# Patient Record
Sex: Female | Born: 1975 | Hispanic: Yes | Marital: Married | Smoking: Never smoker
Health system: Southern US, Community
[De-identification: ages and names within clinical notes are randomized; demographics above are authoritative.]

## PROBLEM LIST (undated history)

## (undated) DIAGNOSIS — K219 Gastro-esophageal reflux disease without esophagitis: Secondary | ICD-10-CM

## (undated) DIAGNOSIS — G43909 Migraine, unspecified, not intractable, without status migrainosus: Secondary | ICD-10-CM

## (undated) DIAGNOSIS — J45909 Unspecified asthma, uncomplicated: Secondary | ICD-10-CM

## (undated) HISTORY — DX: Unspecified asthma, uncomplicated: J45.909

---

## 2016-05-22 ENCOUNTER — Other Ambulatory Visit: Payer: Self-pay | Admitting: Family Medicine

## 2016-05-23 ENCOUNTER — Other Ambulatory Visit: Payer: Self-pay | Admitting: Family Medicine

## 2016-05-23 DIAGNOSIS — O26859 Spotting complicating pregnancy, unspecified trimester: Secondary | ICD-10-CM

## 2016-05-23 DIAGNOSIS — O0990 Supervision of high risk pregnancy, unspecified, unspecified trimester: Secondary | ICD-10-CM

## 2016-05-23 DIAGNOSIS — Z315 Encounter for genetic counseling: Secondary | ICD-10-CM

## 2016-05-25 ENCOUNTER — Other Ambulatory Visit: Payer: Self-pay | Admitting: Family Medicine

## 2016-05-25 DIAGNOSIS — Z369 Encounter for antenatal screening, unspecified: Secondary | ICD-10-CM

## 2016-05-30 ENCOUNTER — Ambulatory Visit
Admission: RE | Admit: 2016-05-30 | Discharge: 2016-05-30 | Disposition: A | Discharge status: Home / Self Care | Payer: Commercial Managed Care - PPO | Source: Ambulatory Visit | Attending: Family Medicine | Admitting: Family Medicine

## 2016-05-30 DIAGNOSIS — O0991 Supervision of high risk pregnancy, unspecified, first trimester: Secondary | ICD-10-CM | POA: Diagnosis not present

## 2016-05-30 DIAGNOSIS — O26851 Spotting complicating pregnancy, first trimester: Secondary | ICD-10-CM | POA: Diagnosis present

## 2016-05-30 DIAGNOSIS — Z315 Encounter for genetic counseling: Secondary | ICD-10-CM | POA: Diagnosis not present

## 2016-05-30 DIAGNOSIS — O468X1 Other antepartum hemorrhage, first trimester: Secondary | ICD-10-CM | POA: Diagnosis not present

## 2016-05-30 DIAGNOSIS — Z3A01 Less than 8 weeks gestation of pregnancy: Secondary | ICD-10-CM | POA: Insufficient documentation

## 2016-05-30 DIAGNOSIS — O0990 Supervision of high risk pregnancy, unspecified, unspecified trimester: Secondary | ICD-10-CM

## 2016-05-30 DIAGNOSIS — O26859 Spotting complicating pregnancy, unspecified trimester: Secondary | ICD-10-CM

## 2016-06-09 DIAGNOSIS — J45909 Unspecified asthma, uncomplicated: Secondary | ICD-10-CM | POA: Insufficient documentation

## 2016-06-22 ENCOUNTER — Ambulatory Visit: Payer: Commercial Managed Care - PPO

## 2016-07-03 ENCOUNTER — Telehealth: Payer: Self-pay | Admitting: Obstetrics and Gynecology

## 2016-07-03 NOTE — Telephone Encounter (Signed)
[name] called to cancel her genetic counseling and ultrasound appointments at [facility] which were planned for 07/06/2016.  She stated that she is getting all of [facility] and does not need to come here.    Cherly Andersoneborah F. Kushal Saunders, MS, CGC

## 2016-07-06 ENCOUNTER — Other Ambulatory Visit: Payer: Commercial Managed Care - PPO

## 2016-11-24 DIAGNOSIS — K219 Gastro-esophageal reflux disease without esophagitis: Secondary | ICD-10-CM | POA: Insufficient documentation

## 2019-09-11 ENCOUNTER — Telehealth: Payer: Self-pay | Admitting: Advanced Practice Midwife

## 2019-09-11 NOTE — Telephone Encounter (Signed)
Called and spoke with [facility] about review from our provider to have patient return with in one year with PCP for papsmear

## 2019-09-11 NOTE — Telephone Encounter (Signed)
Incoming referral from [facility] center presents with abnormal cervix exam possible HPV please eval the need the need for furthur eval. Pap is pending. Per Review from [place]. Patient is normal with no need for referral.

## 2020-07-15 ENCOUNTER — Encounter: Payer: Self-pay | Admitting: Obstetrics and Gynecology

## 2020-07-15 ENCOUNTER — Other Ambulatory Visit (HOSPITAL_COMMUNITY)
Admission: RE | Admit: 2020-07-15 | Discharge: 2020-07-15 | Disposition: A | Discharge status: Home / Self Care | Payer: Commercial Managed Care - PPO | Source: Ambulatory Visit | Attending: Obstetrics and Gynecology | Admitting: Obstetrics and Gynecology

## 2020-07-15 ENCOUNTER — Other Ambulatory Visit: Payer: Self-pay

## 2020-07-15 ENCOUNTER — Ambulatory Visit (INDEPENDENT_AMBULATORY_CARE_PROVIDER_SITE_OTHER): Payer: Commercial Managed Care - PPO | Admitting: Obstetrics and Gynecology

## 2020-07-15 VITALS — BP 119/76 | HR 69 | Ht 65.0 in | Wt 199.9 lb

## 2020-07-15 DIAGNOSIS — N8189 Other female genital prolapse: Secondary | ICD-10-CM

## 2020-07-15 NOTE — Addendum Note (Signed)
Addended by: Dorian Pod on: 07/15/2020 09:12 AM   Modules accepted: Orders

## 2020-07-15 NOTE — Progress Notes (Signed)
HPI:      [name] is a 45 y.o. No obstetric history on file. who LMP was No LMP recorded.  Subjective:   She presents today as a referral from [facility].  She says that the doctor looked at her cervix and thought something was abnormal.  He "saw a shadow".  Her last Pap smear was 1 year ago and although I cannot see the results I can see a call where it was recommended she have a follow-up Pap in 1 year. Of significant note the patient has no pelvic complaints. She does state that she had a very large baby and a somewhat difficult time with her last pregnancy. A call to [facility] provided no further information.    Hx: The following portions of the patient's history were reviewed and updated as appropriate:             She  has a past medical history of Asthma. She does not have a problem list on file. She  has no past surgical history on file. Her family history includes Diabetes in her mother; Lung cancer in her mother. She  reports that she has never smoked. She has never used smokeless tobacco. She reports that she does not drink alcohol and does not use drugs. She has a current medication list which includes the following prescription(s): ranitidine and fluticasone. She has No Known Allergies.       Review of Systems:  Review of Systems  Constitutional: Denied constitutional symptoms, night sweats, recent illness, fatigue, fever, insomnia and weight loss.  Eyes: Denied eye symptoms, eye pain, photophobia, vision change and visual disturbance.  Ears/Nose/Throat/Neck: Denied ear, nose, throat or neck symptoms, hearing loss, nasal discharge, sinus congestion and sore throat.  Cardiovascular: Denied cardiovascular symptoms, arrhythmia, chest pain/pressure, edema, exercise intolerance, orthopnea and palpitations.  Respiratory: Denied pulmonary symptoms, asthma, pleuritic pain, productive sputum, cough, dyspnea and wheezing.   Gastrointestinal: Denied, gastro-esophageal reflux, melena, nausea and vomiting.  Genitourinary: Denied genitourinary symptoms including symptomatic vaginal discharge, pelvic relaxation issues, and urinary complaints.  Musculoskeletal: Denied musculoskeletal symptoms, stiffness, swelling, muscle weakness and myalgia.  Dermatologic: Denied dermatology symptoms, rash and scar.  Neurologic: Denied neurology symptoms, dizziness, headache, neck pain and syncope.  Psychiatric: Denied psychiatric symptoms, anxiety and depression.  Endocrine: Denied endocrine symptoms including hot flashes and night sweats.   Meds:   Current Outpatient Medications on File Prior to Visit  Medication Sig Dispense Refill  . ranitidine (ZANTAC) 150 MG tablet Take by mouth.    . fluticasone (FLONASE) 50 MCG/ACT nasal spray Place 2 sprays into both nostrils daily.     No current facility-administered medications on file prior to visit.          Objective:     Vitals:   07/15/20 0806  BP: 119/76  Pulse: 69   Filed Weights   07/15/20 0806  Weight: 199 lb 14.4 oz (90.7 kg)              Physical examination   Pelvic:  Vulva: Normal appearance.  No lesions.  Vagina: No lesions or abnormalities noted.  Support:  Third-degree rectocele second-degree cystocele  Urethra No masses tenderness or scarring.  Meatus Normal size without lesions or prolapse.  Cervix: Normal appearance.  No lesions.  No abnormalities of the cervix noted  Anus: Normal exam.  No lesions.  Perineum: Normal exam.  No lesions.     Assessment:    No obstetric history on file.  There are no problems to display for this patient.    1. Pelvic relaxation disorder     Possibly it was the rectocele he was concerned with, however the patient is asymptomatic and thus nothing to do about it at this time.  I can see no issues with the cervix.   Plan:            1.  I have reassured the patient.  We have discussed both pelvic  relaxation and abnormal Pap smears in detail.  2.  Because her Pap is due in 3 weeks I have gone ahead and done her Pap smear today to rule out any cervical dysplasia.  Orders No orders of the defined types were placed in this encounter.   No orders of the defined types were placed in this encounter.     F/U  Return for We will contact her with any abnormal test results. I spent 31 minutes involved in the care of this patient preparing to see the patient by obtaining and reviewing her medical history (including labs, imaging tests and prior procedures), documenting clinical information in the electronic health record (EHR), counseling and coordinating care plans, writing and sending prescriptions, ordering tests or procedures and directly communicating with the patient by discussing pertinent items from her history and physical exam as well as detailing my assessment and plan as noted above so that she has an informed understanding.  All of her questions were answered.  Elonda Husky, M.D. 07/15/2020 8:46 AM

## 2020-07-16 LAB — CYTOLOGY - PAP
Comment: NEGATIVE
Diagnosis: NEGATIVE
High risk HPV: NEGATIVE

## 2021-11-03 ENCOUNTER — Ambulatory Visit (INDEPENDENT_AMBULATORY_CARE_PROVIDER_SITE_OTHER): Payer: BLUE CROSS/BLUE SHIELD | Admitting: Gastroenterology

## 2021-11-03 ENCOUNTER — Encounter: Payer: Self-pay | Admitting: Gastroenterology

## 2021-11-03 ENCOUNTER — Other Ambulatory Visit: Payer: Self-pay

## 2021-11-03 VITALS — BP 122/78 | HR 68 | Temp 98.2°F | Ht 65.0 in | Wt 184.0 lb

## 2021-11-03 DIAGNOSIS — R1013 Epigastric pain: Secondary | ICD-10-CM

## 2021-11-03 DIAGNOSIS — G8929 Other chronic pain: Secondary | ICD-10-CM

## 2021-11-03 DIAGNOSIS — R12 Heartburn: Secondary | ICD-10-CM

## 2021-11-03 MED ORDER — OMEPRAZOLE 40 MG PO CPDR: 40.0000 mg | DELAYED_RELEASE_CAPSULE | Freq: Every day | ORAL | 0 refills | Status: AC

## 2021-11-03 NOTE — Progress Notes (Signed)
Arlyss Repress, MD [address]  [address]  [place], [place] [address]  Main: [phone]  Fax: [phone]    Gastroenterology Consultation  Referring Provider:     Toy Cookey, FNP Primary Care Physician:  Toy Cookey, FNP Primary Gastroenterologist:  Dr. Arlyss Repress Reason for Consultation:   Chronic epigastric pain        HPI:   Michele Cantrell is a 46 y.o. female referred by Dr. Toy Cookey, FNP  for consultation & management of chronic epigastric pain.  Patient is here with approximately 2 months history of epigastric burning pain.  Patient was initially experiencing change in her voice, it was thought secondary to reflux, tested for H. pylori by stool samples, treated for H. pylori.  After she was treated for H. pylori, she started experiencing epigastric burning pain.  She denies any heartburn.  She is currently not on any PPI.  She denies any weight loss, nausea or vomiting.  Patient does not smoke or drink alcohol  NSAIDs: None  Antiplts/Anticoagulants/Anti thrombotics: None  GI Procedures: None  Past Medical History:  Diagnosis Date   Asthma     History reviewed. No pertinent surgical history.   Current Outpatient Medications:    fluticasone (FLONASE) 50 MCG/ACT nasal spray, Place 2 sprays into both nostrils daily., Disp: , Rfl:    omeprazole (PRILOSEC) 40 MG capsule, Take 1 capsule (40 mg total) by mouth daily before breakfast., Disp: 30 capsule, Rfl: 0   ondansetron (ZOFRAN-ODT) 4 MG disintegrating tablet, Take by mouth., Disp: , Rfl:    Family History  Problem Relation Age of Onset   Diabetes Mother    Lung cancer Mother      Social History   Tobacco Use   Smoking status: Never   Smokeless tobacco: Never  Substance Use Topics   Alcohol use: Never   Drug use: Never    Allergies as of 11/03/2021   (No Known Allergies)    Review of Systems:    All systems reviewed and negative except where noted in HPI.   Physical  Exam:  BP 122/78 (BP Location: Left Arm, Patient Position: Sitting, Cuff Size: Normal)   Pulse 68   Temp 98.2 F (36.8 C) (Oral)   Ht 5\' 5"  (1.651 m)   Wt 184 lb (83.5 kg)   BMI 30.62 kg/m  No LMP recorded.  General:   Alert,  Well-developed, well-nourished, pleasant and cooperative in NAD Head:  Normocephalic and atraumatic. Eyes:  Sclera clear, no icterus.   Conjunctiva pink. Ears:  Normal auditory acuity. Nose:  No deformity, discharge, or lesions. Mouth:  No deformity or lesions,oropharynx pink & moist. Neck:  Supple; no masses or thyromegaly. Lungs:  Respirations even and unlabored.  Clear throughout to auscultation.   No wheezes, crackles, or rhonchi. No acute distress. Heart:  Regular rate and rhythm; no murmurs, clicks, rubs, or gallops. Abdomen:  Normal bowel sounds. Soft, non-tender and non-distended without masses, hepatosplenomegaly or hernias noted.  No guarding or rebound tenderness.   Rectal: Not performed Msk:  Symmetrical without gross deformities. Good, equal movement & strength bilaterally. Pulses:  Normal pulses noted. Extremities:  No clubbing or edema.  No cyanosis. Neurologic:  Alert and oriented x3;  grossly normal neurologically. Skin:  Intact without significant lesions or rashes. No jaundice. Psych:  Alert and cooperative. Normal mood and affect.  Imaging Studies: No abdominal imaging  Assessment and Plan:   TRULY STANKIEWICZ is a 46 y.o. pleasant Spanish-speaking  female with history of heartburn, treated for H. pylori based on positive stool antigen test is seen in consultation for 2 months history of epigastric burning pain  Recommend EGD with gastric and esophageal biopsies Recommend to start omeprazole 40 mg once a day before breakfast.  Sometimes H. pylori treatment can exacerbate/unmask acid reflux If above work-up is unremarkable and symptoms not relieved, evaluate for gallbladder etiology  Follow up based on the above work-up   Arlyss Repress, MD

## 2021-11-09 ENCOUNTER — Encounter: Payer: Self-pay | Admitting: Gastroenterology

## 2021-11-10 NOTE — Anesthesia Preprocedure Evaluation (Addendum)
Anesthesia Evaluation  Patient identified by MRN, date of birth, ID band Patient awake    Reviewed: Allergy & Precautions, NPO status , Patient's Chart, lab work & pertinent test results  Airway Mallampati: II  TM Distance: >3 FB Neck ROM: full    Dental no notable dental hx.    Pulmonary asthma ,    Pulmonary exam normal        Cardiovascular negative cardio ROS Normal cardiovascular exam     Neuro/Psych negative neurological ROS  negative psych ROS   GI/Hepatic Neg liver ROS, GERD  ,Epigastric pain   Endo/Other  negative endocrine ROS  Renal/GU negative Renal ROS  negative genitourinary   Musculoskeletal   Abdominal Normal abdominal exam  (+)   Peds  Hematology negative hematology ROS (+)   Anesthesia Other Findings Past Medical History: No date: Asthma No date: GERD (gastroesophageal reflux disease) No date: Migraine headache     Comment:  In past  History reviewed. No pertinent surgical history.  BMI    Body Mass Index: 30.62 kg/m      Reproductive/Obstetrics negative OB ROS                            Anesthesia Physical Anesthesia Plan  ASA: 2  Anesthesia Plan: General   Post-op Pain Management: Minimal or no pain anticipated   Induction: Intravenous  PONV Risk Score and Plan: Propofol infusion and TIVA  Airway Management Planned: Natural Airway  Additional Equipment:   Intra-op Plan:   Post-operative Plan:   Informed Consent: I have reviewed the patients History and Physical, chart, labs and discussed the procedure including the risks, benefits and alternatives for the proposed anesthesia with the patient or authorized representative who has indicated his/her understanding and acceptance.     Dental Advisory Given  Plan Discussed with: Anesthesiologist, CRNA and Surgeon  Anesthesia Plan Comments:        Anesthesia Quick Evaluation

## 2021-11-15 ENCOUNTER — Other Ambulatory Visit: Payer: Self-pay

## 2021-11-15 ENCOUNTER — Encounter: Payer: Self-pay | Admitting: Gastroenterology

## 2021-11-15 ENCOUNTER — Ambulatory Visit
Admission: RE | Admit: 2021-11-15 | Discharge: 2021-11-15 | Disposition: A | Discharge status: Home / Self Care | Payer: BLUE CROSS/BLUE SHIELD | Attending: Gastroenterology | Admitting: Gastroenterology

## 2021-11-15 ENCOUNTER — Ambulatory Visit: Payer: BLUE CROSS/BLUE SHIELD | Admitting: Anesthesiology

## 2021-11-15 ENCOUNTER — Encounter
Admission: RE | Disposition: A | Discharge status: Home / Self Care | Payer: Self-pay | Source: Home / Self Care | Attending: Gastroenterology

## 2021-11-15 DIAGNOSIS — K31A19 Gastric intestinal metaplasia without dysplasia, unspecified site: Secondary | ICD-10-CM | POA: Insufficient documentation

## 2021-11-15 DIAGNOSIS — J45909 Unspecified asthma, uncomplicated: Secondary | ICD-10-CM | POA: Insufficient documentation

## 2021-11-15 DIAGNOSIS — R1013 Epigastric pain: Secondary | ICD-10-CM | POA: Insufficient documentation

## 2021-11-15 DIAGNOSIS — K2940 Chronic atrophic gastritis without bleeding: Secondary | ICD-10-CM | POA: Insufficient documentation

## 2021-11-15 DIAGNOSIS — B9681 Helicobacter pylori [H. pylori] as the cause of diseases classified elsewhere: Secondary | ICD-10-CM | POA: Diagnosis not present

## 2021-11-15 DIAGNOSIS — K219 Gastro-esophageal reflux disease without esophagitis: Secondary | ICD-10-CM | POA: Diagnosis not present

## 2021-11-15 HISTORY — PX: ESOPHAGOGASTRODUODENOSCOPY (EGD) WITH PROPOFOL: SHX5813

## 2021-11-15 HISTORY — DX: Migraine, unspecified, not intractable, without status migrainosus: G43.909

## 2021-11-15 HISTORY — DX: Gastro-esophageal reflux disease without esophagitis: K21.9

## 2021-11-15 SURGERY — ESOPHAGOGASTRODUODENOSCOPY (EGD) WITH PROPOFOL
Anesthesia: General | Site: Mouth

## 2021-11-15 MED ORDER — DEXMEDETOMIDINE HCL IN NACL 80 MCG/20ML IV SOLN
INTRAVENOUS | Status: DC | PRN
  Administered 2021-11-15: 10 ug via BUCCAL

## 2021-11-15 MED ORDER — PROPOFOL 10 MG/ML IV BOLUS
INTRAVENOUS | Status: DC | PRN
  Administered 2021-11-15 (×6): 50 mg via INTRAVENOUS

## 2021-11-15 MED ORDER — LIDOCAINE HCL (CARDIAC) PF 100 MG/5ML IV SOSY
PREFILLED_SYRINGE | INTRAVENOUS | Status: DC | PRN
  Administered 2021-11-15: 100 mg via INTRAVENOUS

## 2021-11-15 MED ORDER — SODIUM CHLORIDE 0.9 % IV SOLN: INTRAVENOUS | Status: DC

## 2021-11-15 MED ORDER — GLYCOPYRROLATE 0.2 MG/ML IJ SOLN
INTRAMUSCULAR | Status: DC | PRN
  Administered 2021-11-15: .1 mg via INTRAVENOUS

## 2021-11-15 MED ORDER — STERILE WATER FOR IRRIGATION IR SOLN
Status: DC | PRN
  Administered 2021-11-15: 100 mL

## 2021-11-15 MED ORDER — LACTATED RINGERS IV SOLN: INTRAVENOUS | Status: DC

## 2021-11-15 SURGICAL SUPPLY — 8 items
BLOCK BITE 60FR ADLT L/F GRN (MISCELLANEOUS) ×1 IMPLANT
FORCEPS BIOP RAD 4 LRG CAP 4 (CUTTING FORCEPS) IMPLANT
GOWN CVR UNV OPN BCK APRN NK (MISCELLANEOUS) ×2 IMPLANT
GOWN ISOL THUMB LOOP REG UNIV (MISCELLANEOUS) ×2
KIT PRC NS LF DISP ENDO (KITS) ×1 IMPLANT
KIT PROCEDURE OLYMPUS (KITS) ×1
MANIFOLD NEPTUNE II (INSTRUMENTS) ×1 IMPLANT
WATER STERILE IRR 250ML POUR (IV SOLUTION) ×1 IMPLANT

## 2021-11-15 NOTE — Transfer of Care (Signed)
Immediate Anesthesia Transfer of Care Note  Patient: KORISSA HORSFORD  Procedure(s) Performed: ESOPHAGOGASTRODUODENOSCOPY (EGD) WITH BIOPSIES (Mouth)  Patient Location: PACU  Anesthesia Type: General  Level of Consciousness: awake, alert  and patient cooperative  Airway and Oxygen Therapy: Patient Spontanous Breathing and Patient connected to supplemental oxygen  Post-op Assessment: Post-op Vital signs reviewed, Patient's Cardiovascular Status Stable, Respiratory Function Stable, Patent Airway and No signs of Nausea or vomiting  Post-op Vital Signs: Reviewed and stable  Complications: There were no known notable events for this encounter.

## 2021-11-15 NOTE — Op Note (Signed)
[facility] Gastroenterology Patient Name: Michele Cantrell Procedure Date: 11/15/2021 11:30 AM MRN: [id number] Account #: [id number] Date of Birth: [birth year] Admit Type: Outpatient Age: 46 Room: [facility] OR ROOM 01 Gender: Female Note Status: Finalized Instrument Name: [id number] Procedure:             Upper GI endoscopy Indications:           Epigastric abdominal pain, Follow-up of Helicobacter                         pylori Providers:             Lin Landsman MD, MD Medicines:             General Anesthesia Complications:         No immediate complications. Estimated blood loss: None. Procedure:             Pre-Anesthesia Assessment:                        - Prior to the procedure, a History and Physical was                         performed, and patient medications and allergies were                         reviewed. The patient is competent. The risks and                         benefits of the procedure and the sedation options and                         risks were discussed with the patient. All questions                         were answered and informed consent was obtained.                         Patient identification and proposed procedure were                         verified by the physician, the nurse, the                         anesthesiologist, the anesthetist and the technician                         in the pre-procedure area in the procedure room in the                         endoscopy suite. Mental Status Examination: alert and                         oriented. Airway Examination: normal oropharyngeal                         airway and neck mobility. Respiratory Examination:                         clear to auscultation. CV Examination: normal.  Prophylactic Antibiotics: The patient does not require                         prophylactic antibiotics. Prior Anticoagulants: The                         patient has taken no  previous anticoagulant or                         antiplatelet agents. ASA Grade Assessment: II - A                         patient with mild systemic disease. After reviewing                         the risks and benefits, the patient was deemed in                         satisfactory condition to undergo the procedure. The                         anesthesia plan was to use general anesthesia.                         Immediately prior to administration of medications,                         the patient was re-assessed for adequacy to receive                         sedatives. The heart rate, respiratory rate, oxygen                         saturations, blood pressure, adequacy of pulmonary                         ventilation, and response to care were monitored                         throughout the procedure. The physical status of the                         patient was re-assessed after the procedure.                        After obtaining informed consent, the endoscope was                         passed under direct vision. Throughout the procedure,                         the patient's blood pressure, pulse, and oxygen                         saturations were monitored continuously. The Endoscope                         was introduced through the mouth, and advanced to the  second part of duodenum. The upper GI endoscopy was                         accomplished without difficulty. The patient tolerated                         the procedure well. Findings:      The duodenal bulb and second portion of the duodenum were normal.      The entire examined stomach was normal. Biopsies were taken with a cold       forceps for Helicobacter pylori testing.      The cardia and gastric fundus were normal on retroflexion.      The gastroesophageal junction and examined esophagus were normal.       Biopsies were taken with a cold forceps for histology. Impression:             - Normal duodenal bulb and second portion of the                         duodenum.                        - Normal stomach. Biopsied.                        - Normal gastroesophageal junction and esophagus.                         Biopsied. Recommendation:        - Await pathology results.                        - Discharge patient to home (with spouse).                        - Resume previous diet today.                        - Continue present medications. Procedure Code(s):     --- Professional ---                        [phone], Esophagogastroduodenoscopy, flexible,                         transoral; with biopsy, single or multiple Diagnosis Code(s):     --- Professional ---                        R10.13, Epigastric pain                        B96.81, Helicobacter pylori [H. pylori] as the cause                         of diseases classified elsewhere CPT copyright 2019 American Medical Association. All rights reserved. The codes documented in this report are preliminary and upon coder review may  be revised to meet current compliance requirements. Dr. Libby Maw Toney Reil MD, MD 11/15/2021 11:52:00 AM This report has been signed electronically. Number of Addenda: 0 Note Initiated On: 11/15/2021 11:30 AM Total Procedure Duration: 0 hours 7  minutes 6 seconds  Estimated Blood Loss:  Estimated blood loss: none.      [facility]

## 2021-11-15 NOTE — Anesthesia Postprocedure Evaluation (Signed)
Anesthesia Post Note  Patient: Michele Cantrell  Procedure(s) Performed: ESOPHAGOGASTRODUODENOSCOPY (EGD) WITH BIOPSIES (Mouth)     Patient location during evaluation: PACU Anesthesia Type: General Level of consciousness: awake and alert Pain management: pain level controlled Vital Signs Assessment: post-procedure vital signs reviewed and stable Respiratory status: spontaneous breathing, nonlabored ventilation and respiratory function stable Cardiovascular status: blood pressure returned to baseline and stable Postop Assessment: no apparent nausea or vomiting Anesthetic complications: no   There were no known notable events for this encounter.  [place] Ouch

## 2021-11-15 NOTE — H&P (Signed)
Cephas Darby, MD [address]  [place]  [place],  [address]  Main: [phone]  Fax: [phone] Pager: [phone]  Primary Care Physician:  [facility] Primary Gastroenterologist:  Dr. Cephas Darby  Pre-Procedure History & Physical: HPI:  Michele Cantrell is a 46 y.o. female is here for an endoscopy.   Past Medical History:  Diagnosis Date   Asthma    GERD (gastroesophageal reflux disease)    Migraine headache    In past    History reviewed. No pertinent surgical history.  Prior to Admission medications   Medication Sig Start Date End Date Taking? Authorizing Provider  albuterol (VENTOLIN HFA) 108 (90 Base) MCG/ACT inhaler Inhale into the lungs every 6 (six) hours as needed for wheezing or shortness of breath.   Yes [provider]  cetirizine (ZYRTEC) 10 MG tablet Take 10 mg by mouth daily as needed for allergies.   Yes [provider]  omeprazole (PRILOSEC) 40 MG capsule Take 1 capsule (40 mg total) by mouth daily before breakfast. 11/03/21  Yes Samyiah Halvorsen, Tally Due, MD  fluticasone (FLONASE) 50 MCG/ACT nasal spray Place 2 sprays into both nostrils daily. Patient not taking: Reported on 11/09/2021 03/23/20   [provider]  ondansetron (ZOFRAN-ODT) 4 MG disintegrating tablet Take by mouth. Patient not taking: Reported on 11/09/2021 07/05/21   [provider]    Allergies as of 11/03/2021   (No Known Allergies)    Family History  Problem Relation Age of Onset   Diabetes Mother    Lung cancer Mother     Social History   Socioeconomic History   Marital status: Married    Spouse name: Not on file   Number of children: Not on file   Years of education: Not on file   Highest education level: Not on file  Occupational History   Not on file  Tobacco Use   Smoking status: Never   Smokeless tobacco: Never   Tobacco comments:    Tried smoking as a teenager  Vaping Use   Vaping Use: Never  used  Substance and Sexual Activity   Alcohol use: Never   Drug use: Never   Sexual activity: Yes    Partners: Male    Birth control/protection: None  Other Topics Concern   Not on file  Social History Narrative   Not on file   Social Determinants of Health   Financial Resource Strain: Not on file  Food Insecurity: Not on file  Transportation Needs: Not on file  Physical Activity: Not on file  Stress: Not on file  Social Connections: Not on file  Intimate Partner Violence: Not on file    Review of Systems: See HPI, otherwise negative ROS  Physical Exam: BP 119/87   Temp 98.1 F (36.7 C) (Tympanic)   Resp 19   Ht 5\' 5"  (1.651 m)   Wt 83.1 kg   LMP 11/06/2021 (Exact Date) Comment: pregnancy test done  SpO2 100%   BMI 30.47 kg/m  General:   Alert,  pleasant and cooperative in NAD Head:  Normocephalic and atraumatic. Neck:  Supple; no masses or thyromegaly. Lungs:  Clear throughout to auscultation.    Heart:  Regular rate and rhythm. Abdomen:  Soft, nontender and nondistended. Normal bowel sounds, without guarding, and without rebound.   Neurologic:  Alert and  oriented x4;  grossly normal neurologically.  Impression/Plan: Michele Cantrell is here for an endoscopy to be performed for Chronic epigastric pain  Risks, benefits, limitations, and alternatives regarding  endoscopy have been reviewed with the patient.  Questions have been answered.  All parties agreeable.   Sherri Sear, MD  11/15/2021, 10:32 AM

## 2021-11-16 ENCOUNTER — Encounter: Payer: Self-pay | Admitting: Gastroenterology

## 2021-11-17 LAB — POCT PREGNANCY, URINE: Preg Test, Ur: NEGATIVE

## 2021-11-21 ENCOUNTER — Telehealth: Payer: Self-pay

## 2021-11-21 DIAGNOSIS — K2940 Chronic atrophic gastritis without bleeding: Secondary | ICD-10-CM

## 2021-11-21 LAB — SURGICAL PATHOLOGY

## 2021-11-21 NOTE — Telephone Encounter (Signed)
Order the labs. Used interpreter service ID number is  [id number]. They called patient and patient verbalized understanding of results and instructions

## 2021-11-21 NOTE — Telephone Encounter (Signed)
-----   Message from Lin Landsman, MD sent at 11/21/2021 12:48 PM EDT ----- Please inform patient that the pathology results from upper endoscopy shows that she has a special type of inflammation in her stomach but it did not show any H. pylori infection.  Recommend to check intrinsic factor antibodies, parietal cell antibodies, stop omeprazole for 2 weeks and perform H. pylori breath test after discontinuation of omeprazole for 2 weeks  Dx: Atrophic gastritis  Rohini Vanga

## 2021-12-13 ENCOUNTER — Other Ambulatory Visit: Payer: Self-pay

## 2021-12-13 DIAGNOSIS — R1013 Epigastric pain: Secondary | ICD-10-CM

## 2021-12-13 DIAGNOSIS — K2940 Chronic atrophic gastritis without bleeding: Secondary | ICD-10-CM

## 2021-12-14 LAB — ANTI-PARIETAL ANTIBODY: Parietal Cell Ab: 1.5 Units (ref 0.0–20.0)

## 2021-12-14 LAB — INTRINSIC FACTOR ANTIBODIES: Intrinsic Factor Abs, Serum: 1 AU/mL (ref 0.0–1.1)

## 2021-12-15 LAB — H. PYLORI BREATH TEST: H pylori Breath Test: NEGATIVE

## 2021-12-21 ENCOUNTER — Telehealth: Payer: Self-pay

## 2021-12-21 NOTE — Telephone Encounter (Signed)
Used interpreter service ID [id number] Patient verbalized understanding of results she states she is still having epigastric pain that comes and goes. She states the pain is better with the omeprazole and she is not eating Greasy or spicy food.

## 2021-12-21 NOTE — Telephone Encounter (Signed)
-----   Message from Lin Landsman, MD sent at 12/21/2021 11:42 AM EDT ----- Please inform patient that the blood test results and H. pylori breath test came back normal.  Please check with patient if she is still having symptoms of epigastric pain?  RV

## 2021-12-22 NOTE — Telephone Encounter (Signed)
Used interpreter service and patient verbalized understanding and states she will give [place] a call back to schedule EGD put in a recall

## 2021-12-22 NOTE — Telephone Encounter (Signed)
Continue omeprazole 40 mg daily before meals Recommend to schedule upper endoscopy in 3 months for gastric mapping Dx: Atrophic gastritis and intestinal metaplasia  RV

## 2023-01-31 ENCOUNTER — Other Ambulatory Visit: Payer: Self-pay | Admitting: Nurse Practitioner

## 2023-01-31 DIAGNOSIS — Z1231 Encounter for screening mammogram for malignant neoplasm of breast: Secondary | ICD-10-CM
